# Patient Record
Sex: Female | Born: 1960 | Race: White | Hispanic: No | Marital: Married
Health system: Southern US, Community
[De-identification: ages and names within clinical notes are randomized; demographics above are authoritative.]

## PROBLEM LIST (undated history)

## (undated) DIAGNOSIS — F32A Depression, unspecified: Secondary | ICD-10-CM

## (undated) DIAGNOSIS — F329 Major depressive disorder, single episode, unspecified: Secondary | ICD-10-CM

## (undated) DIAGNOSIS — E039 Hypothyroidism, unspecified: Secondary | ICD-10-CM

---

## 2004-12-19 ENCOUNTER — Ambulatory Visit: Payer: Self-pay | Admitting: Family Medicine

## 2005-06-22 ENCOUNTER — Ambulatory Visit: Payer: Self-pay | Admitting: Family Medicine

## 2006-11-26 IMAGING — US ABDOMEN ULTRASOUND
1 series · 17 of 25 positions shown · non-contrast
Comparison: none

REASON FOR EXAM: ELEVATED LIVER ENZYMES
COMMENTS:

[Series 1: abdomen ultrasound · 17 of 50 slices shown]
[im 1/50]
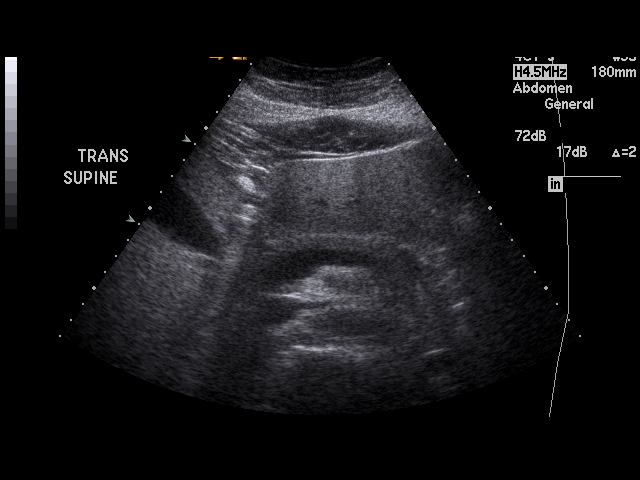
[im 5/50]
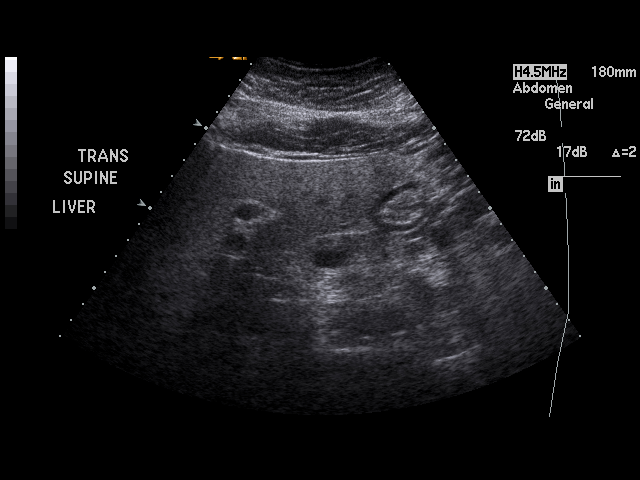
[im 7/50]
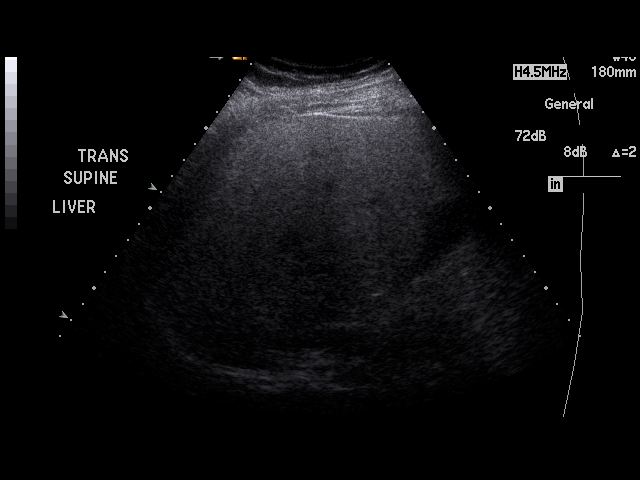
[im 11/50]
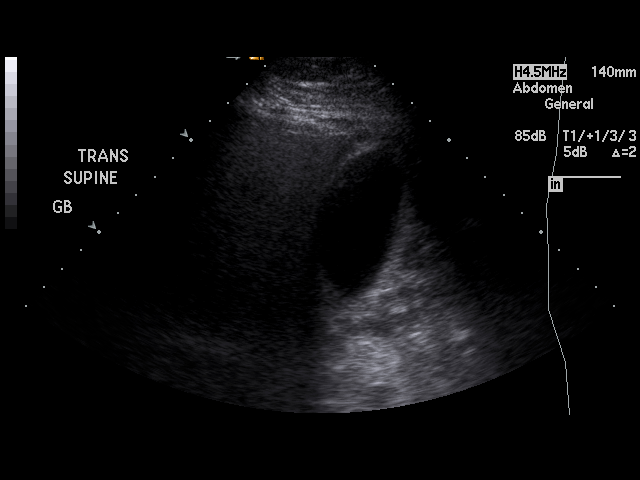
[im 13/50]
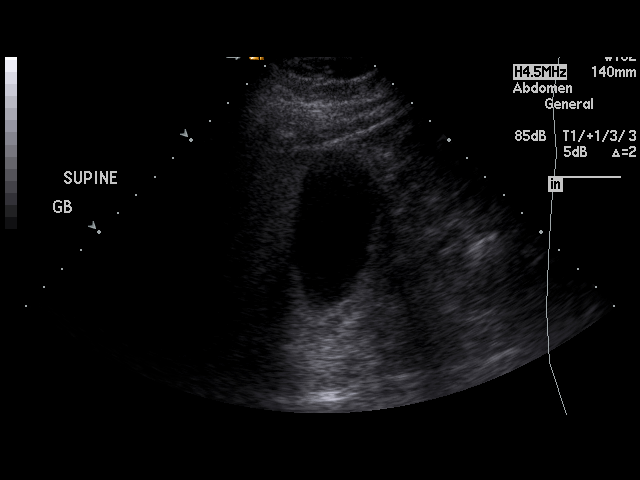
[im 17/50]
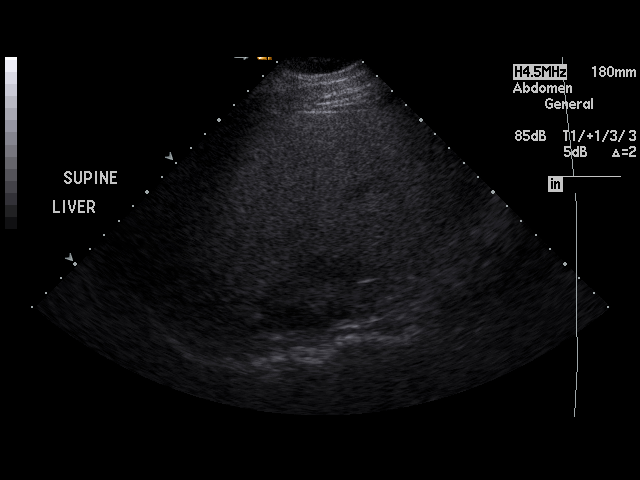
[im 19/50]
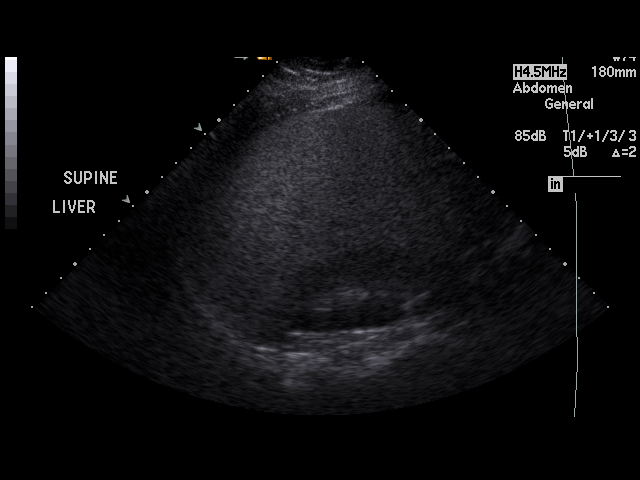
[im 23/50]
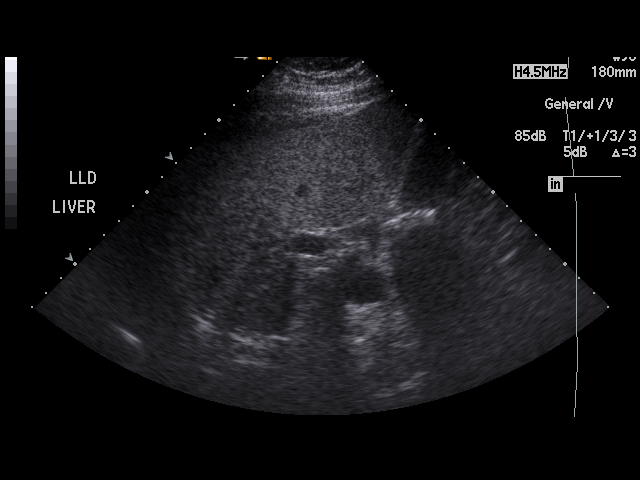
[im 25/50]
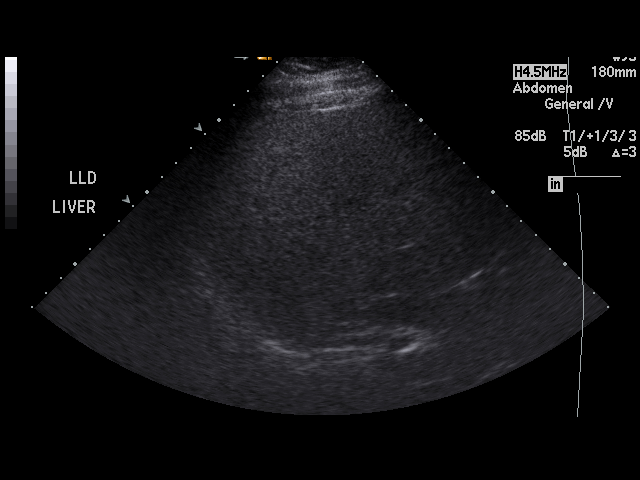
[im 27/50]
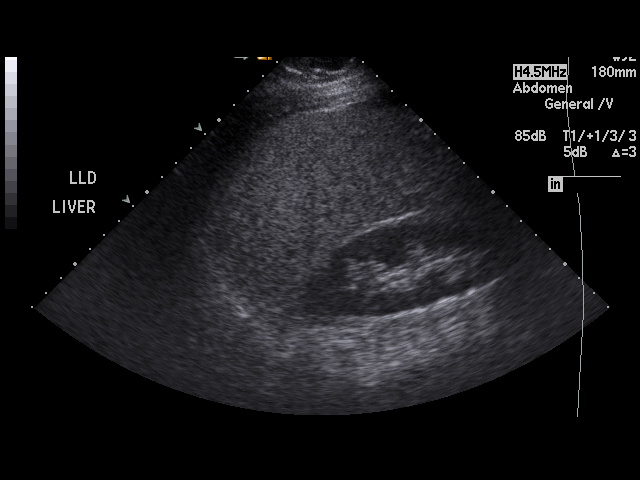
[im 31/50]
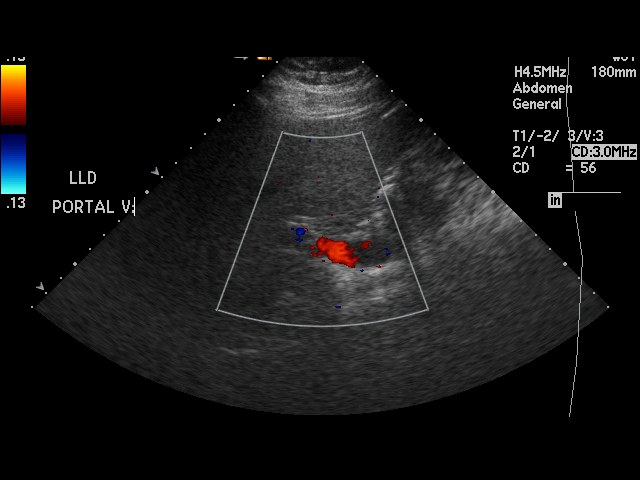
[im 33/50]
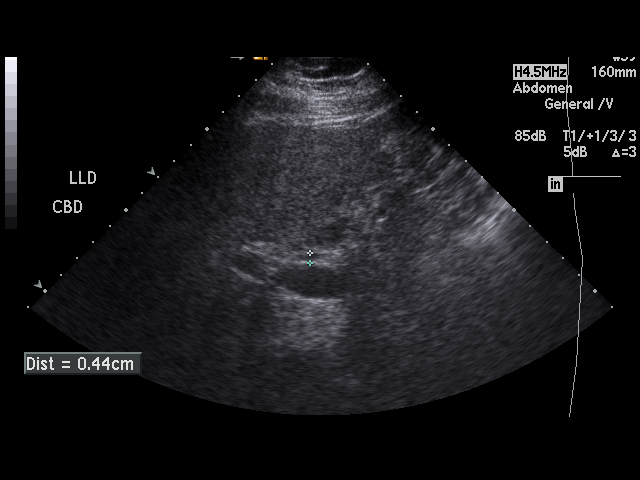
[im 37/50]
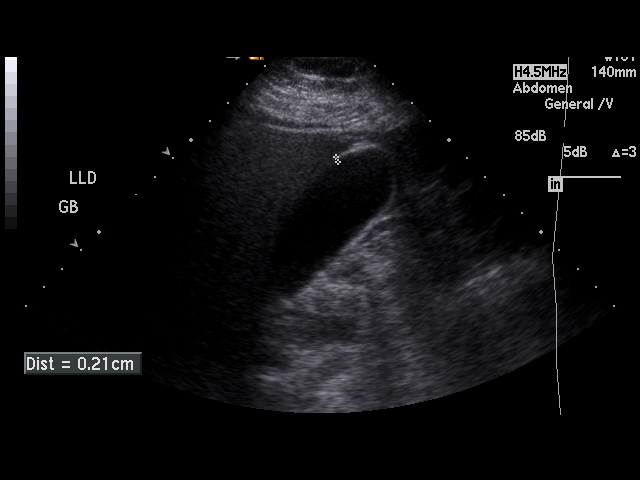
[im 39/50]
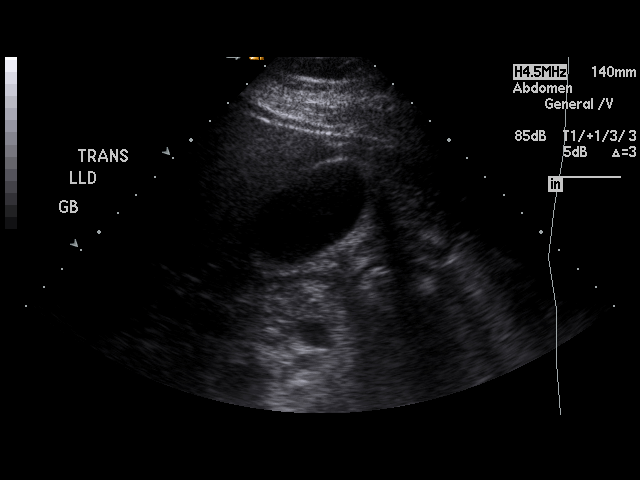
[im 43/50]
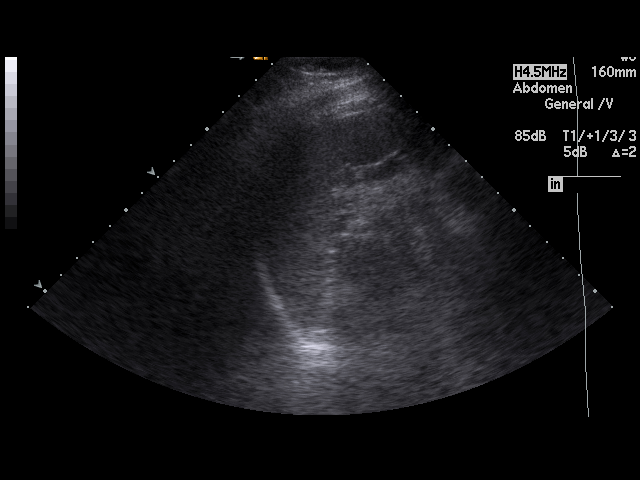
[im 45/50]
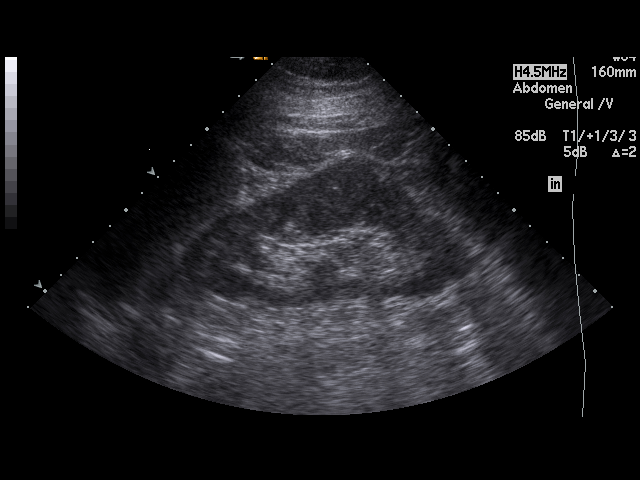
[im 50/50]
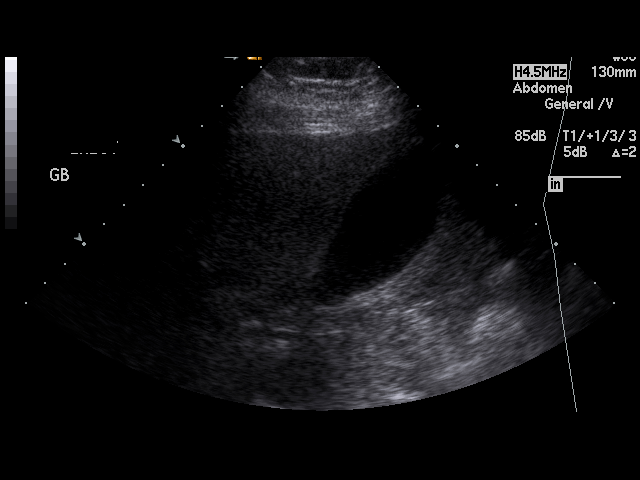

[17 of 25 positions shown; findings below may reference images not displayed]

PROCEDURE:     US  - US ABDOMEN GENERAL SURVEY  - December 19, 2004  [DATE]

RESULT:       The liver is dense, suspicious for fatty infiltration.  No
focal hepatic mass lesions are seen.  The pancreas is normal in size.  The
spleen is normal in appearance.  No gallstones are seen.  There is no
thickening of the gallbladder wall.  The common bile duct measures 4.4 mm in
diameter which is within normal limits.  The kidneys show no hydronephrosis.
 There is no ascites.
IMPRESSION: 1.     Possible fatty infiltration of the liver.
2.     Otherwise normal study.

## 2013-04-08 ENCOUNTER — Ambulatory Visit: Payer: Self-pay | Admitting: Family Medicine

## 2014-06-07 ENCOUNTER — Ambulatory Visit
Admission: RE | Admit: 2014-06-07 | Payer: BC Managed Care – PPO | Source: Ambulatory Visit | Admitting: Unknown Physician Specialty

## 2014-06-07 ENCOUNTER — Encounter: Admission: RE | Payer: Self-pay | Source: Ambulatory Visit

## 2014-06-07 SURGERY — COLONOSCOPY
Anesthesia: General

## 2014-08-02 ENCOUNTER — Encounter: Payer: Self-pay | Admitting: *Deleted

## 2014-08-02 ENCOUNTER — Ambulatory Visit
Admission: RE | Admit: 2014-08-02 | Discharge: 2014-08-02 | Disposition: A | Discharge status: Home / Self Care | Payer: BC Managed Care – PPO | Source: Ambulatory Visit | Attending: Unknown Physician Specialty | Admitting: Unknown Physician Specialty

## 2014-08-02 ENCOUNTER — Ambulatory Visit: Payer: BC Managed Care – PPO | Admitting: Anesthesiology

## 2014-08-02 ENCOUNTER — Encounter
Admission: RE | Disposition: A | Discharge status: Home / Self Care | Payer: Self-pay | Source: Ambulatory Visit | Attending: Unknown Physician Specialty

## 2014-08-02 DIAGNOSIS — Z1211 Encounter for screening for malignant neoplasm of colon: Secondary | ICD-10-CM | POA: Insufficient documentation

## 2014-08-02 DIAGNOSIS — K5730 Diverticulosis of large intestine without perforation or abscess without bleeding: Secondary | ICD-10-CM | POA: Insufficient documentation

## 2014-08-02 DIAGNOSIS — D122 Benign neoplasm of ascending colon: Secondary | ICD-10-CM | POA: Insufficient documentation

## 2014-08-02 DIAGNOSIS — K644 Residual hemorrhoidal skin tags: Secondary | ICD-10-CM | POA: Diagnosis not present

## 2014-08-02 DIAGNOSIS — K641 Second degree hemorrhoids: Secondary | ICD-10-CM | POA: Diagnosis not present

## 2014-08-02 HISTORY — DX: Depression, unspecified: F32.A

## 2014-08-02 HISTORY — PX: COLONOSCOPY: SHX5424

## 2014-08-02 HISTORY — DX: Major depressive disorder, single episode, unspecified: F32.9

## 2014-08-02 HISTORY — DX: Hypothyroidism, unspecified: E03.9

## 2014-08-02 LAB — GLUCOSE, CAPILLARY: Glucose-Capillary: 98 mg/dL (ref 65–99)

## 2014-08-02 SURGERY — COLONOSCOPY
Anesthesia: General

## 2014-08-02 MED ORDER — MIDAZOLAM HCL 5 MG/5ML IJ SOLN
INTRAMUSCULAR | Status: DC | PRN
  Administered 2014-08-02: 1 mg via INTRAVENOUS

## 2014-08-02 MED ORDER — PROPOFOL INFUSION 10 MG/ML OPTIME
INTRAVENOUS | Status: DC | PRN
Start: 2014-08-02 — End: 2014-08-02
  Administered 2014-08-02: 100 ug/kg/min via INTRAVENOUS

## 2014-08-02 MED ORDER — FENTANYL CITRATE (PF) 100 MCG/2ML IJ SOLN
INTRAMUSCULAR | Status: DC | PRN
  Administered 2014-08-02: 50 ug via INTRAVENOUS

## 2014-08-02 MED ORDER — PROPOFOL 10 MG/ML IV BOLUS
INTRAVENOUS | Status: DC | PRN
  Administered 2014-08-02: 50 mg via INTRAVENOUS

## 2014-08-02 MED ORDER — SODIUM CHLORIDE 0.9 % IV SOLN
INTRAVENOUS | Status: DC
  Administered 2014-08-02: 1000 mL via INTRAVENOUS

## 2014-08-02 MED ORDER — LIDOCAINE HCL (PF) 2 % IJ SOLN
INTRAMUSCULAR | Status: DC | PRN
  Administered 2014-08-02: 50 mg

## 2014-08-02 NOTE — Transfer of Care (Signed)
Immediate Anesthesia Transfer of Care Note  Patient: Krystal Bradshaw  Procedure(s) Performed: Procedure(s): COLONOSCOPY (N/A)  Patient Location: PACU  Anesthesia Type:General  Level of Consciousness: sedated  Airway & Oxygen Therapy: Patient Spontanous Breathing and Patient connected to nasal cannula oxygen  Post-op Assessment: Report given to RN and Post -op Vital signs reviewed and stable  Post vital signs: Reviewed and stable  Last Vitals:  Filed Vitals:   08/02/14 1116  BP: 143/100  Pulse: 91  Temp: 37.2 C  Resp: 16    Complications: No apparent anesthesia complications

## 2014-08-02 NOTE — Anesthesia Preprocedure Evaluation (Signed)
Anesthesia Evaluation  Patient identified by MRN, date of birth, ID band Patient awake    Reviewed: Allergy & Precautions, H&P , NPO status , Patient's Chart, lab work & pertinent test results, reviewed documented beta blocker date and time   Airway Mallampati: II  TM Distance: >3 FB Neck ROM: full    Dental no notable dental hx.    Pulmonary neg pulmonary ROS,  breath sounds clear to auscultation  Pulmonary exam normal       Cardiovascular Exercise Tolerance: Good negative cardio ROS  Rhythm:regular Rate:Normal     Neuro/Psych PSYCHIATRIC DISORDERS negative neurological ROS  negative psych ROS   GI/Hepatic negative GI ROS, Neg liver ROS,   Endo/Other  negative endocrine ROSHypothyroidism   Renal/GU negative Renal ROS  negative genitourinary   Musculoskeletal   Abdominal   Peds  Hematology negative hematology ROS (+)   Anesthesia Other Findings   Reproductive/Obstetrics negative OB ROS                             Anesthesia Physical Anesthesia Plan  ASA: II  Anesthesia Plan: General   Post-op Pain Management:    Induction:   Airway Management Planned:   Additional Equipment:   Intra-op Plan:   Post-operative Plan:   Informed Consent: I have reviewed the patients History and Physical, chart, labs and discussed the procedure including the risks, benefits and alternatives for the proposed anesthesia with the patient or authorized representative who has indicated his/her understanding and acceptance.   Dental Advisory Given  Plan Discussed with: CRNA  Anesthesia Plan Comments:         Anesthesia Quick Evaluation

## 2014-08-02 NOTE — Op Note (Signed)
[facility] Patient Name: Krystal Bradshaw Procedure Date: 08/02/2014 11:44 AM MRN: [id number] Account #: [id number] Date of Birth: [birth year] Admit Type: Outpatient Age: 54 Room: [facility] ENDO ROOM 1 Gender: Female Note Status: Finalized Procedure:         Colonoscopy Indications:       Screening for colorectal malignant neoplasm Providers:         Manya Silvas, MD Referring MD:      Hester Mates, MD (Referring MD) Medicines:         Propofol per Anesthesia Complications:     No immediate complications. Procedure:         Pre-Anesthesia Assessment:                    - After reviewing the risks and benefits, the patient was                     deemed in satisfactory condition to undergo the procedure.                    After obtaining informed consent, the colonoscope was                     passed under direct vision. Throughout the procedure, the                     patient's blood pressure, pulse, and oxygen saturations                     were monitored continuously. The Olympus PCF-160AL                     Colonoscope (S#. [id number]) was introduced through the anus                     and advanced to the the cecum, identified by appendiceal                     orifice and ileocecal valve. The colonoscopy was performed                     without difficulty. The patient tolerated the procedure                     well. The quality of the bowel preparation was excellent. Findings:      A diminutive polyp was found in the proximal ascending colon. The polyp       was sessile. The polyp was removed with a jumbo cold forceps. Resection       and retrieval were complete.      Many medium-mouthed diverticula were found in the sigmoid colon and in       the descending colon.      External and internal hemorrhoids were found during digital exam. The       hemorrhoids were small, medium-sized and Grade II (internal hemorrhoids       that prolapse but  reduce spontaneously).      The exam was otherwise without abnormality. Impression:        - One diminutive polyp in the proximal ascending colon.                     Resected and retrieved.                    -  Diverticulosis in the sigmoid colon and in the                     descending colon.                    - External and internal hemorrhoids.                    - The examination was otherwise normal. Recommendation:    - Await pathology results. Manya Silvas, MD 08/02/2014 12:28:43 PM This report has been signed electronically. Number of Addenda: 0 Note Initiated On: 08/02/2014 11:44 AM Scope Withdrawal Time: 0 hours 14 minutes 18 seconds  Total Procedure Duration: 0 hours 24 minutes 35 seconds       [facility]

## 2014-08-03 LAB — SURGICAL PATHOLOGY

## 2014-08-03 NOTE — Anesthesia Postprocedure Evaluation (Signed)
  Anesthesia Post-op Note  Patient: Krystal Bradshaw  Procedure(s) Performed: Procedure(s): COLONOSCOPY (N/A)  Anesthesia type:General  Patient location: PACU  Post pain: Pain level controlled  Post assessment: Post-op Vital signs reviewed, Patient's Cardiovascular Status Stable, Respiratory Function Stable, Patent Airway and No signs of Nausea or vomiting  Post vital signs: Reviewed and stable  Last Vitals:  Filed Vitals:   08/02/14 1250  BP: 115/83  Pulse: 73  Temp:   Resp: 20    Level of consciousness: awake, alert  and patient cooperative  Complications: No apparent anesthesia complications

## 2015-03-16 IMAGING — CR DG CHEST 2V
1 series · 3 of 3 positions shown · non-contrast
Comparison: None.

CLINICAL DATA: Asthma, cough

EXAM:
CHEST  2 VIEW

[Series 1: pa · 0.17mm/px · 3 of 3 slices shown]
[im 1/3]
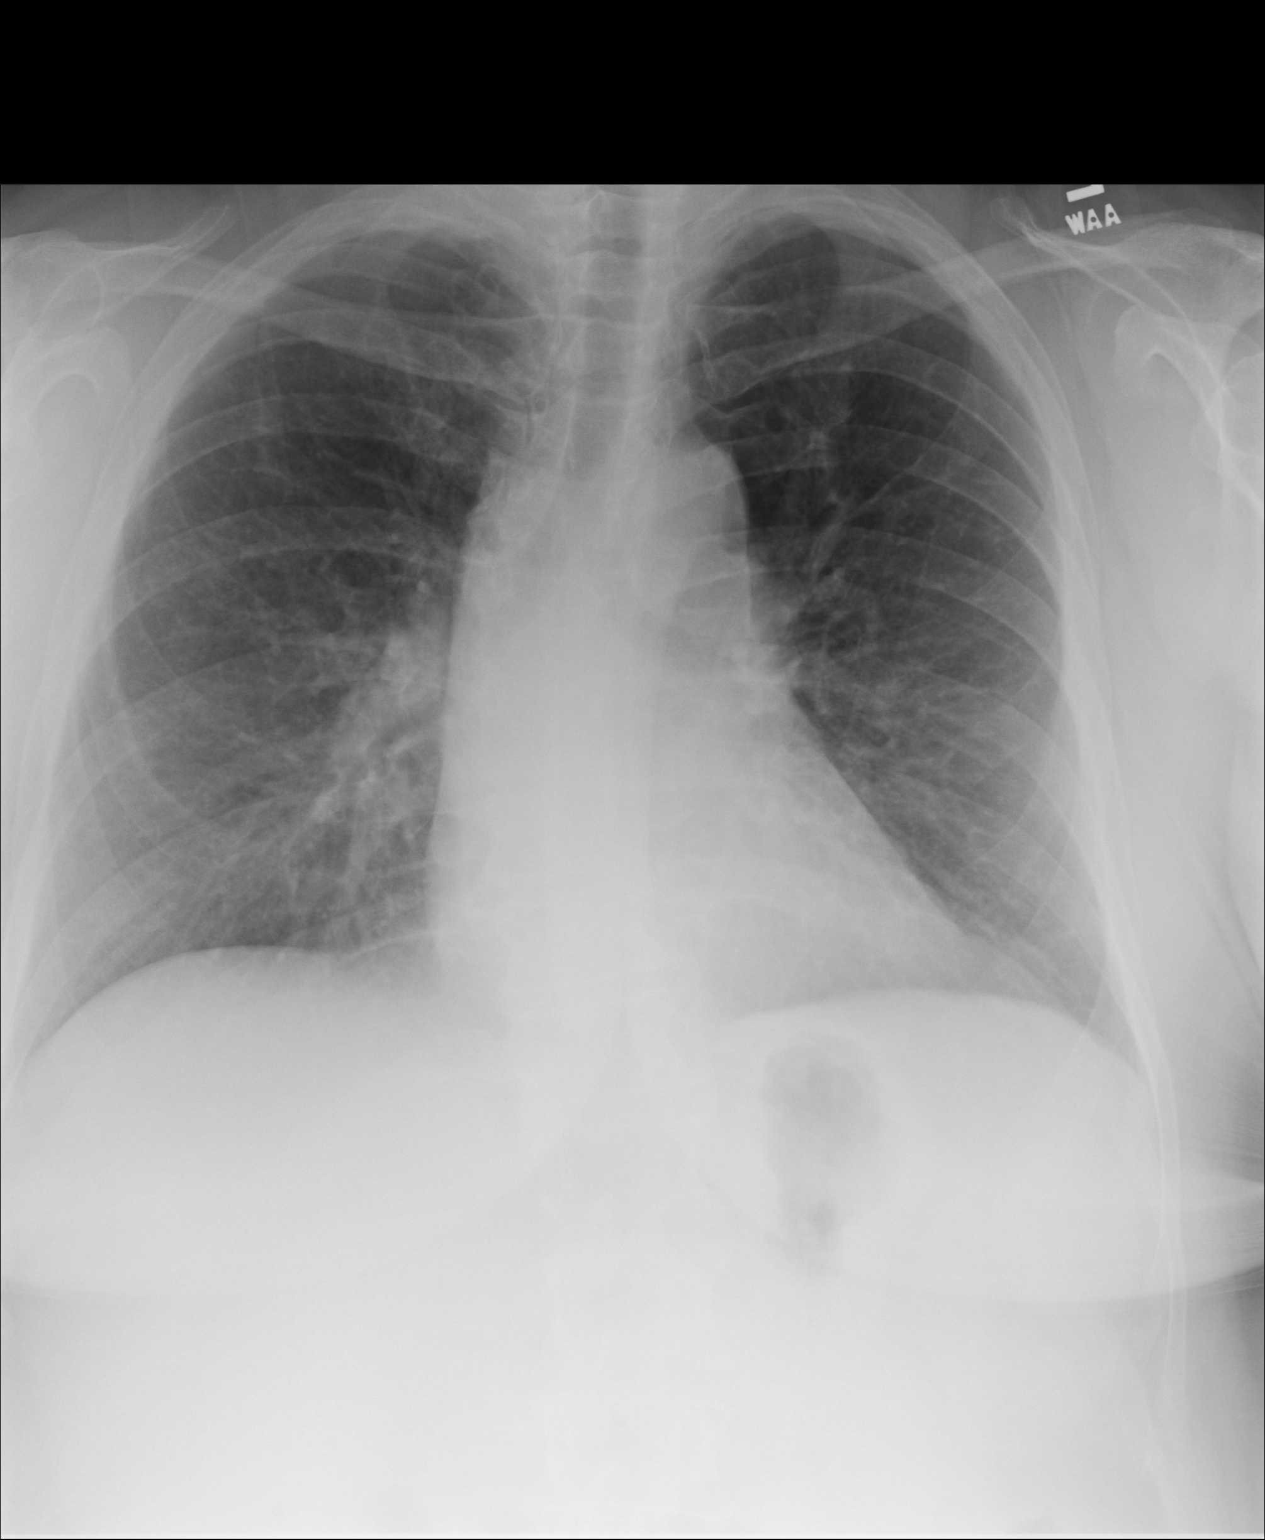
[im 2/3]
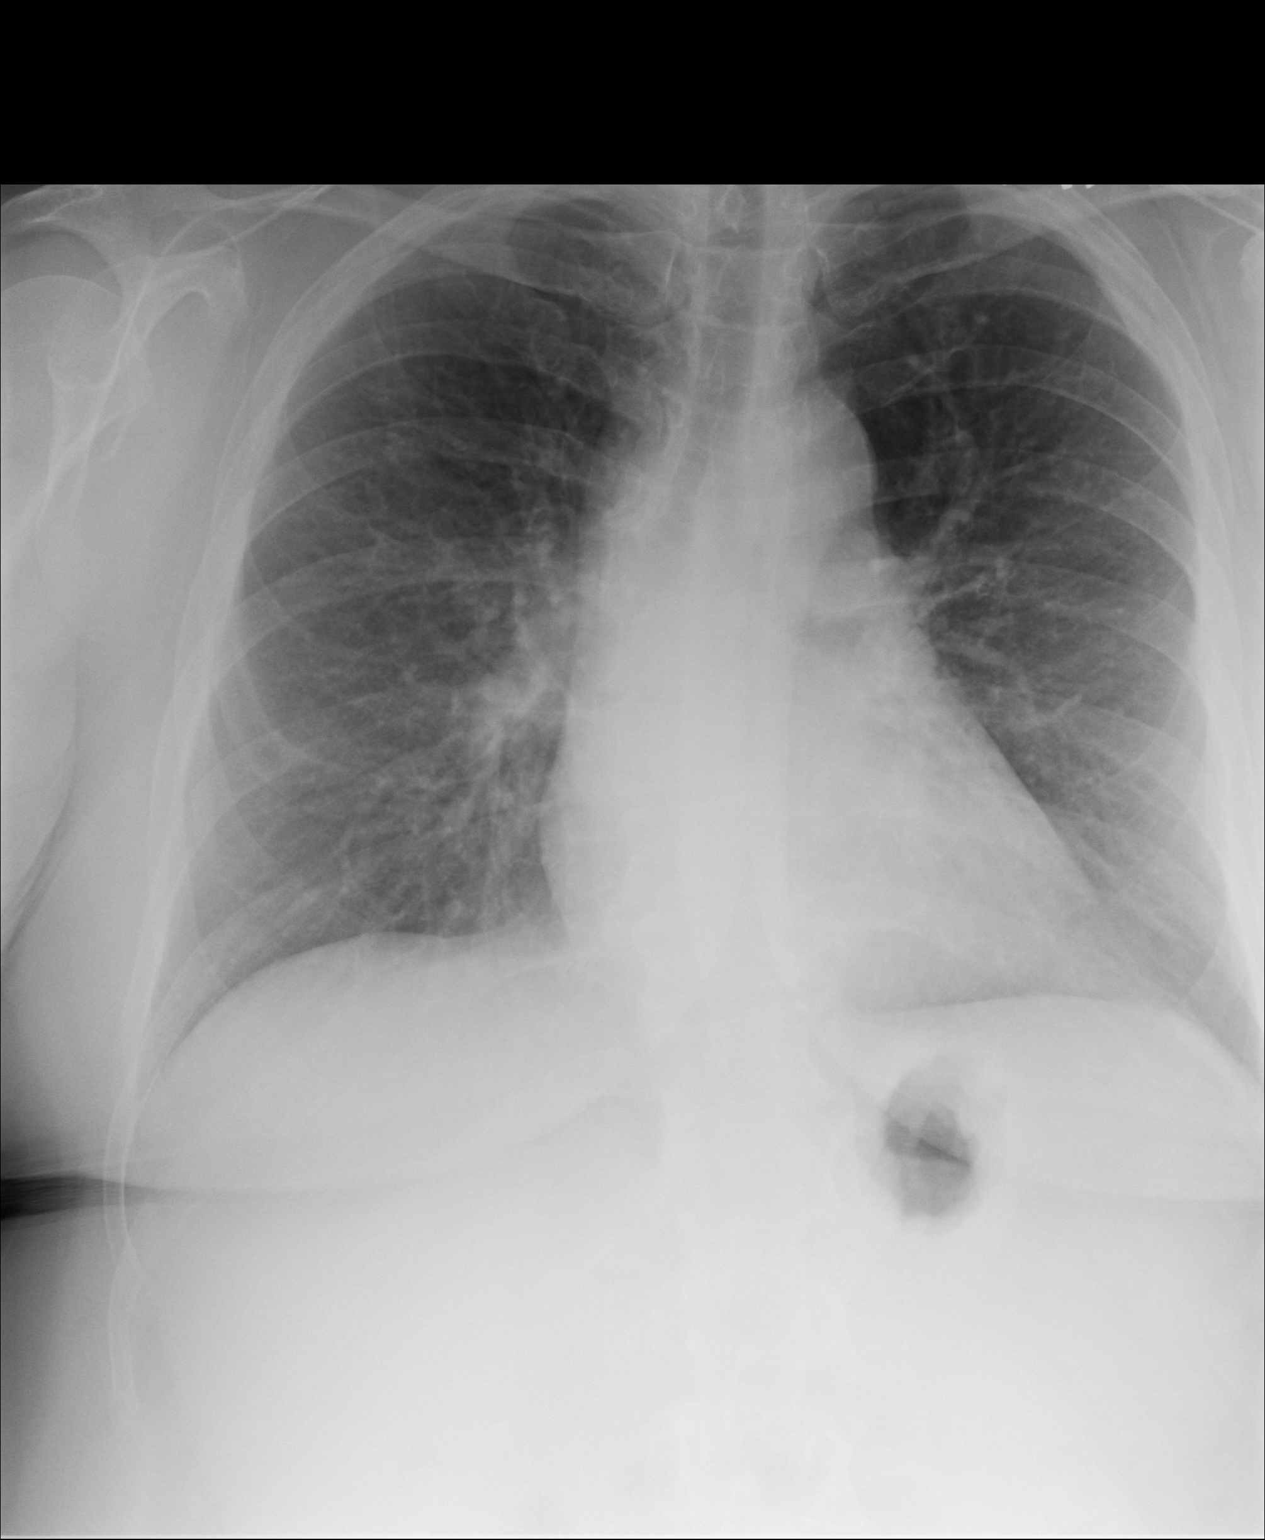
[im 3/3]
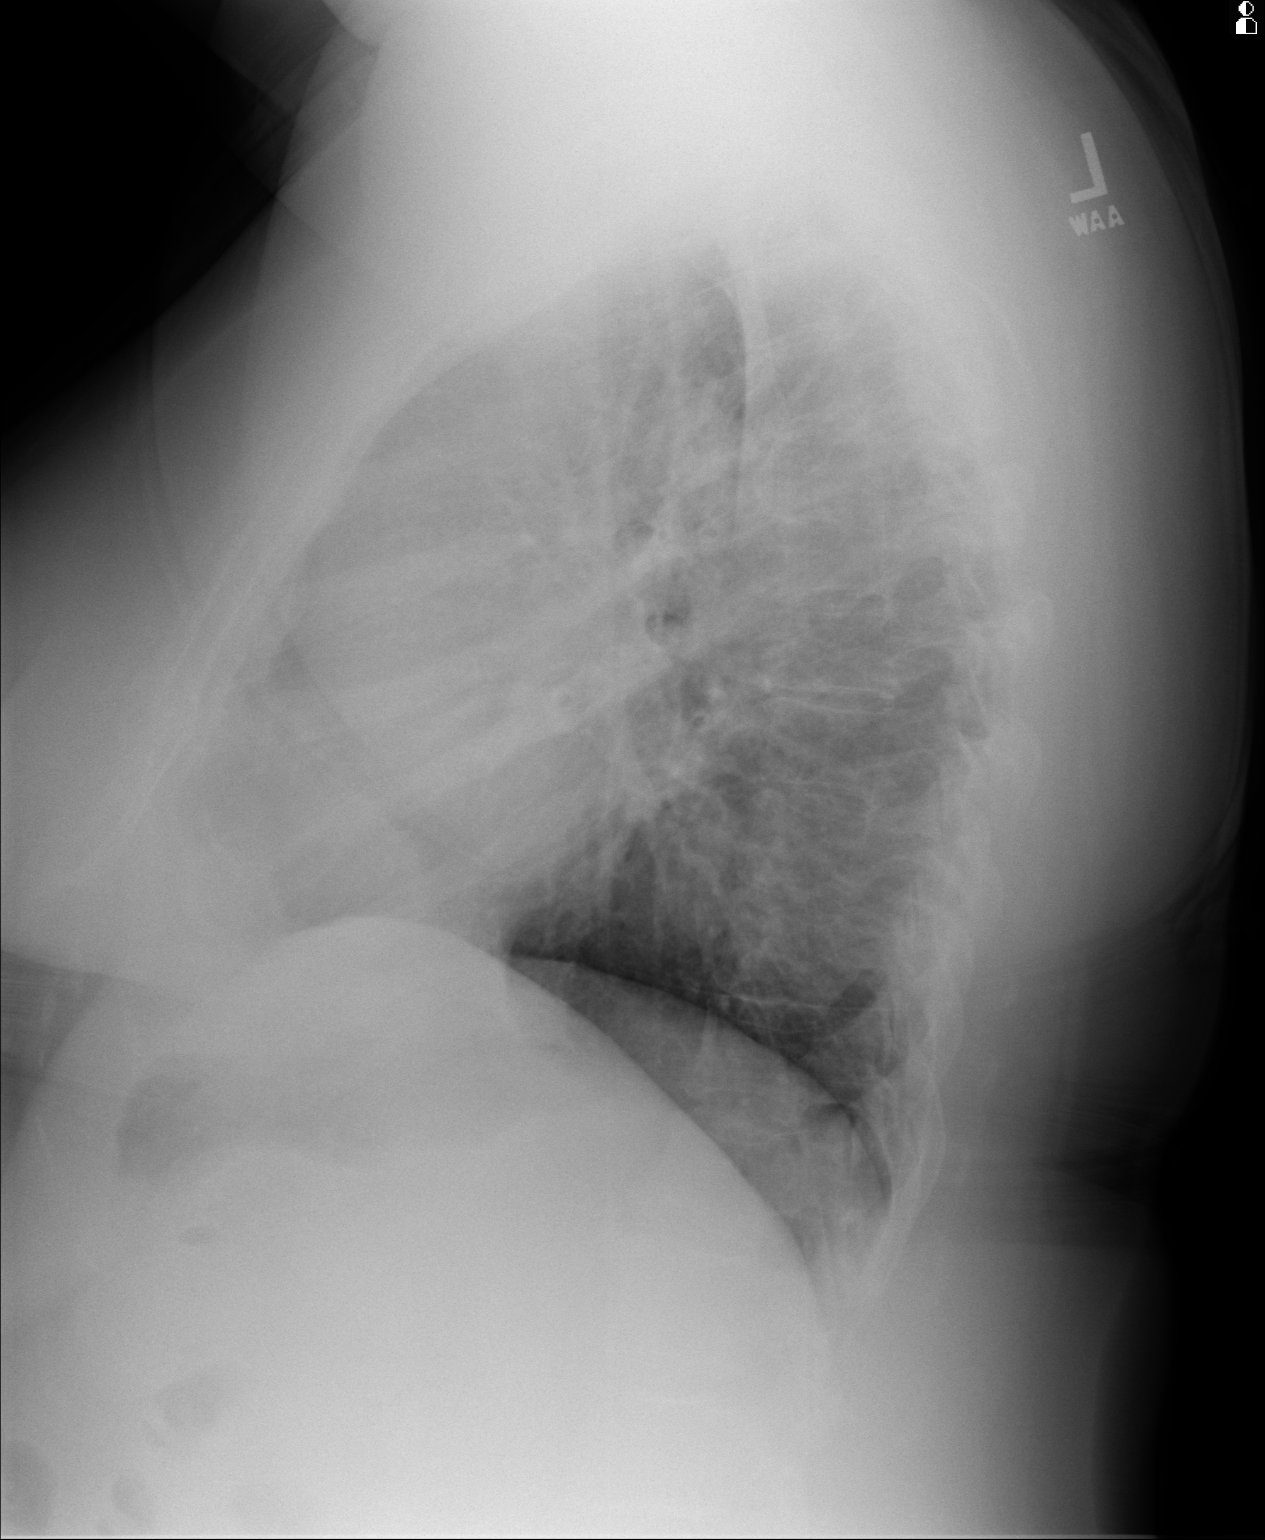

[3 of 3 positions shown; findings below may reference images not displayed]

FINDINGS: Cardiomediastinal silhouette is unremarkable. No acute infiltrate or
pulmonary edema. Mild thoracic dextroscoliosis.
IMPRESSION: No active disease.  Mild thoracic dextroscoliosis.

## 2019-02-09 ENCOUNTER — Ambulatory Visit: Payer: Self-pay | Admitting: *Deleted

## 2019-02-09 NOTE — Telephone Encounter (Signed)
Pt would also like to ask questions about son. He is scheduled to work at [employer] at 38. Requesting a call back as soon as possible  ----- Message from Rayann Heman sent at 02/09/2019 11:10 AM EST -----  Pt called and stated that she was exposed to covid on christmas . Pt would like to know if she should be tested. Please advise.

## 2019-02-09 NOTE — Telephone Encounter (Signed)
This patient is not a current patient at this office. She has not been since here since at least before June 2016.

## 2019-02-09 NOTE — Telephone Encounter (Signed)
Message from Rayann Heman sent at 02/09/2019 11:12 AM EST  Summary: covid questions    Pt would also like to ask questions about son. He is scheduled to work at [employer] at 67. Requesting a call back as soon as possible  ----- Message from Rayann Heman sent at 02/09/2019 11:10 AM EST -----  Pt called and stated that she was exposed to covid on christmas . Pt would like to know if she should be tested. Please advise.          Pt exposed Christmas day but did not quarantine so is unsure if could have been exposed since. No symptoms. Advised to test and I will notify PCP via routing message.  Reason for Disposition . [1] CLOSE CONTACT COVID-19 EXPOSURE within last 14 days AND [2] NO symptoms  Answer Assessment - Initial Assessment Questions 1. COVID-19 CLOSE CONTACT: "Who is the person with the confirmed or suspected COVID-19 infection that you were exposed to?"     Family member 12/[address] 2[address] of CONTACT: "Where were you when you were exposed to COVID-19?" (e.g., home, school, medical waiting room; which city?)     Their home 3. TYPE of CONTACT: "How much contact was there?" (e.g., sitting next to, live in same house, work in same office, same building)     In house 4. DURATION of CONTACT: "How long were you in contact with the COVID-19 patient?" (e.g., a few seconds, passed by person, a few minutes, 15 minutes or longer, live with the patient)     2 hrs 5. MASK: "Were you wearing a mask?" "Was the other person wearing a mask?" Note: wearing a mask reduces the risk of an  otherwise close contact.     no 6. DATE of CONTACT: "When did you have contact with a COVID-19 patient?" (e.g., how many days ago)     12/31 7. COMMUNITY SPREAD: "Are there lots of cases of COVID-19 (community spread) where you live?" (See public health department website, if unsure)       [place] 8. SYMPTOMS: "Do you have any symptoms?" (e.g., fever, cough, breathing difficulty, loss of taste or smell)    no 9. PREGNANCY OR POSTPARTUM: "Is there any chance you are pregnant?" "When was your last menstrual period?" "Did you deliver in the last 2 weeks?"     no 10. HIGH RISK: "Do you have any heart or lung problems? Do you have a weak immune system?" (e.g., heart failure, COPD, asthma, HIV positive, chemotherapy, renal failure, diabetes mellitus, sickle cell anemia, obesity)       no 11.  TRAVEL: "Have you traveled out of the country recently?" If so, "When and where?"  Also ask about out-of-state travel, since the CDC has identified some high-risk cities for community spread in the [place].  Note: Travel becomes less relevant if there is widespread community transmission where the patient lives.       no  Protocols used: CORONAVIRUS (COVID-19) EXPOSURE-A-AH

## 2019-11-03 ENCOUNTER — Other Ambulatory Visit: Payer: BC Managed Care – PPO
